# Patient Record
Sex: Male | Born: 1935 | Race: White | Hispanic: No | Marital: Married
Health system: Southern US, Community
[De-identification: ages and names within clinical notes are randomized; demographics above are authoritative.]

---

## 2006-10-20 ENCOUNTER — Ambulatory Visit: Payer: Self-pay | Admitting: Gastroenterology

## 2006-11-10 ENCOUNTER — Other Ambulatory Visit: Payer: Self-pay

## 2006-11-10 ENCOUNTER — Emergency Department: Payer: Self-pay | Admitting: Emergency Medicine

## 2006-11-22 ENCOUNTER — Ambulatory Visit: Payer: Self-pay | Admitting: Internal Medicine

## 2007-02-04 ENCOUNTER — Other Ambulatory Visit: Payer: Self-pay

## 2007-02-04 ENCOUNTER — Emergency Department: Payer: Self-pay | Admitting: Unknown Physician Specialty

## 2007-04-17 ENCOUNTER — Ambulatory Visit: Payer: Self-pay | Admitting: Urology

## 2009-01-02 ENCOUNTER — Ambulatory Visit: Payer: Self-pay | Admitting: Gastroenterology

## 2009-09-25 ENCOUNTER — Emergency Department: Payer: Self-pay | Admitting: Emergency Medicine

## 2009-11-25 ENCOUNTER — Inpatient Hospital Stay: Payer: Self-pay | Admitting: Internal Medicine

## 2010-08-20 ENCOUNTER — Ambulatory Visit: Payer: Self-pay | Admitting: Urology

## 2010-09-01 ENCOUNTER — Ambulatory Visit: Payer: Self-pay | Admitting: Urology

## 2010-09-03 LAB — PATHOLOGY REPORT

## 2010-12-16 ENCOUNTER — Inpatient Hospital Stay: Payer: Self-pay | Admitting: Internal Medicine

## 2012-02-01 ENCOUNTER — Inpatient Hospital Stay: Payer: Self-pay | Admitting: Internal Medicine

## 2012-02-01 LAB — COMPREHENSIVE METABOLIC PANEL
Alkaline Phosphatase: 78 U/L (ref 50–136)
Anion Gap: 12 (ref 7–16)
BUN: 16 mg/dL (ref 7–18)
Bilirubin,Total: 1.4 mg/dL — ABNORMAL HIGH (ref 0.2–1.0)
Chloride: 103 mmol/L (ref 98–107)
Co2: 25 mmol/L (ref 21–32)
Creatinine: 1.13 mg/dL (ref 0.60–1.30)
EGFR (African American): >60
Glucose: 94 mg/dL (ref 65–99)
Osmolality: 280 (ref 275–301)
SGPT (ALT): 14 U/L
Sodium: 140 mmol/L (ref 136–145)
Total Protein: 7.6 g/dL (ref 6.4–8.2)

## 2012-02-01 LAB — CBC
HCT: 48.4 % (ref 40.0–52.0)
HGB: 15.8 g/dL (ref 13.0–18.0)
MCH: 30.4 pg (ref 26.0–34.0)
MCHC: 32.7 g/dL (ref 32.0–36.0)
MCV: 93 fL (ref 80–100)
RBC: 5.19 10*6/uL (ref 4.40–5.90)
RDW: 12.6 % (ref 11.5–14.5)
WBC: 15.3 10*3/uL — ABNORMAL HIGH (ref 3.8–10.6)

## 2012-02-01 LAB — PRO B NATRIURETIC PEPTIDE: B-Type Natriuretic Peptide: 2650 pg/mL — ABNORMAL HIGH (ref 0–450)

## 2012-02-02 LAB — BASIC METABOLIC PANEL
Anion Gap: 14 (ref 7–16)
Co2: 25 mmol/L (ref 21–32)
Creatinine: 1.57 mg/dL — ABNORMAL HIGH (ref 0.60–1.30)
EGFR (African American): 54 — ABNORMAL LOW
EGFR (Non-African Amer.): 45 — ABNORMAL LOW
Glucose: 142 mg/dL — ABNORMAL HIGH (ref 65–99)
Osmolality: 290 (ref 275–301)
Potassium: 3.6 mmol/L (ref 3.5–5.1)
Sodium: 142 mmol/L (ref 136–145)

## 2012-02-02 LAB — CBC WITH DIFFERENTIAL/PLATELET
Basophil #: 0 10*3/uL (ref 0.0–0.1)
Basophil %: 0 %
HGB: 14.9 g/dL (ref 13.0–18.0)
Lymphocyte #: 0.6 10*3/uL — ABNORMAL LOW (ref 1.0–3.6)
Lymphocyte %: 3.2 %
MCH: 31.2 pg (ref 26.0–34.0)
MCHC: 33.2 g/dL (ref 32.0–36.0)
MCV: 94 fL (ref 80–100)
Monocyte #: 1 10*3/uL — ABNORMAL HIGH (ref 0.0–0.7)
Neutrophil #: 18 10*3/uL — ABNORMAL HIGH (ref 1.4–6.5)
Neutrophil %: 91.7 %
Platelet: 188 10*3/uL (ref 150–440)
RBC: 4.79 10*6/uL (ref 4.40–5.90)
RDW: 13.6 % (ref 11.5–14.5)

## 2012-02-02 LAB — MAGNESIUM: Magnesium: 1.4 mg/dL — ABNORMAL LOW

## 2012-02-03 LAB — BASIC METABOLIC PANEL
Anion Gap: 11 (ref 7–16)
BUN: 36 mg/dL — ABNORMAL HIGH (ref 7–18)
Calcium, Total: 9 mg/dL (ref 8.5–10.1)
Co2: 26 mmol/L (ref 21–32)
Creatinine: 1.34 mg/dL — ABNORMAL HIGH (ref 0.60–1.30)
EGFR (African American): >60
EGFR (Non-African Amer.): 54 — ABNORMAL LOW
Osmolality: 292 (ref 275–301)
Potassium: 3.6 mmol/L (ref 3.5–5.1)
Sodium: 142 mmol/L (ref 136–145)

## 2012-02-03 LAB — URINALYSIS, COMPLETE
Bilirubin,UR: NEGATIVE
Hyaline Cast: 25
Nitrite: POSITIVE
Ph: 5 (ref 4.5–8.0)
Protein: NEGATIVE
RBC,UR: 8 /HPF (ref 0–5)
Specific Gravity: 1.018 (ref 1.003–1.030)

## 2012-02-03 LAB — CBC WITH DIFFERENTIAL/PLATELET
Basophil #: 0.1 10*3/uL (ref 0.0–0.1)
Eosinophil %: 0 %
MCH: 31.5 pg (ref 26.0–34.0)
Monocyte #: 0.5 10*3/uL (ref 0.0–0.7)
Monocyte %: 2.7 %
Neutrophil %: 94 %
Platelet: 180 10*3/uL (ref 150–440)
RBC: 4.62 10*6/uL (ref 4.40–5.90)
RDW: 13.7 % (ref 11.5–14.5)
WBC: 20 10*3/uL — ABNORMAL HIGH (ref 3.8–10.6)

## 2012-02-04 LAB — CBC WITH DIFFERENTIAL/PLATELET
Eosinophil #: 0 10*3/uL (ref 0.0–0.7)
Eosinophil %: 0 %
Lymphocyte #: 0.4 10*3/uL — ABNORMAL LOW (ref 1.0–3.6)
MCH: 31.5 pg (ref 26.0–34.0)
MCHC: 33.5 g/dL (ref 32.0–36.0)
MCV: 94 fL (ref 80–100)
Monocyte #: 0.6 10*3/uL (ref 0.0–0.7)
Neutrophil #: 14.7 10*3/uL — ABNORMAL HIGH (ref 1.4–6.5)
Neutrophil %: 93.9 %
Platelet: 195 10*3/uL (ref 150–440)
RBC: 4.47 10*6/uL (ref 4.40–5.90)

## 2012-02-04 LAB — BASIC METABOLIC PANEL
Anion Gap: 14 (ref 7–16)
BUN: 32 mg/dL — ABNORMAL HIGH (ref 7–18)
Chloride: 108 mmol/L — ABNORMAL HIGH (ref 98–107)
Creatinine: 1.13 mg/dL (ref 0.60–1.30)
EGFR (African American): >60
EGFR (Non-African Amer.): >60
Glucose: 118 mg/dL — ABNORMAL HIGH (ref 65–99)

## 2012-02-05 LAB — CBC WITH DIFFERENTIAL/PLATELET
Basophil %: 0 %
Eosinophil #: 0 10*3/uL (ref 0.0–0.7)
Eosinophil %: 0 %
HCT: 39.3 % — ABNORMAL LOW (ref 40.0–52.0)
HGB: 13.1 g/dL (ref 13.0–18.0)
Lymphocyte #: 0.6 10*3/uL — ABNORMAL LOW (ref 1.0–3.6)
Lymphocyte %: 5.3 %
MCH: 31.4 pg (ref 26.0–34.0)
MCV: 95 fL (ref 80–100)
Monocyte #: 0.9 10*3/uL — ABNORMAL HIGH (ref 0.0–0.7)
Monocyte %: 8.5 %
Neutrophil #: 9.2 10*3/uL — ABNORMAL HIGH (ref 1.4–6.5)
Platelet: 180 10*3/uL (ref 150–440)
RBC: 4.16 10*6/uL — ABNORMAL LOW (ref 4.40–5.90)
RDW: 13.6 % (ref 11.5–14.5)
WBC: 10.7 10*3/uL — ABNORMAL HIGH (ref 3.8–10.6)

## 2012-02-05 LAB — BASIC METABOLIC PANEL
Anion Gap: 10 (ref 7–16)
Calcium, Total: 8 mg/dL — ABNORMAL LOW (ref 8.5–10.1)
Chloride: 109 mmol/L — ABNORMAL HIGH (ref 98–107)
Co2: 25 mmol/L (ref 21–32)
Creatinine: 1.06 mg/dL (ref 0.60–1.30)
Sodium: 144 mmol/L (ref 136–145)

## 2012-02-06 LAB — CULTURE, BLOOD (SINGLE)

## 2012-02-07 LAB — BASIC METABOLIC PANEL
Anion Gap: 11 (ref 7–16)
BUN: 20 mg/dL — ABNORMAL HIGH (ref 7–18)
Calcium, Total: 8.4 mg/dL — ABNORMAL LOW (ref 8.5–10.1)
Co2: 28 mmol/L (ref 21–32)
Creatinine: 1.03 mg/dL (ref 0.60–1.30)
EGFR (African American): >60
Glucose: 101 mg/dL — ABNORMAL HIGH (ref 65–99)

## 2012-02-14 ENCOUNTER — Ambulatory Visit: Payer: Self-pay

## 2012-03-30 ENCOUNTER — Emergency Department: Payer: Self-pay | Admitting: Emergency Medicine

## 2012-05-13 DEATH — deceased

## 2013-06-08 IMAGING — CR DG CHEST 2V
1 series · 4 of 4 positions shown · non-contrast
Comparison: none

REASON FOR EXAM: cough
COMMENTS:

[Series 1: ap · 0.17mm/px · 4 of 4 slices shown]
[im 1/4]
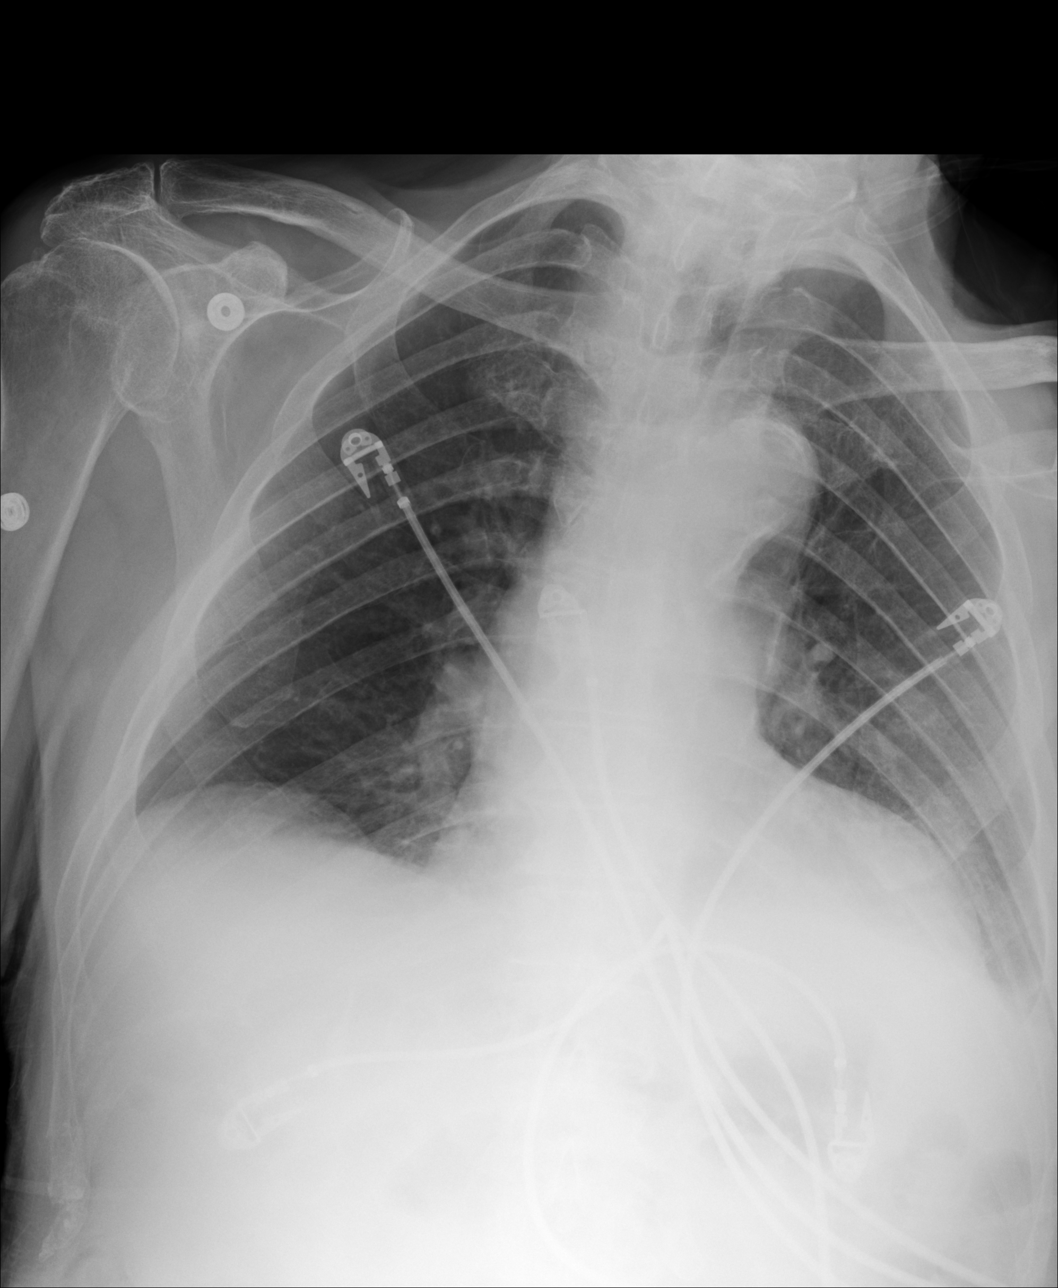
[im 2/4]
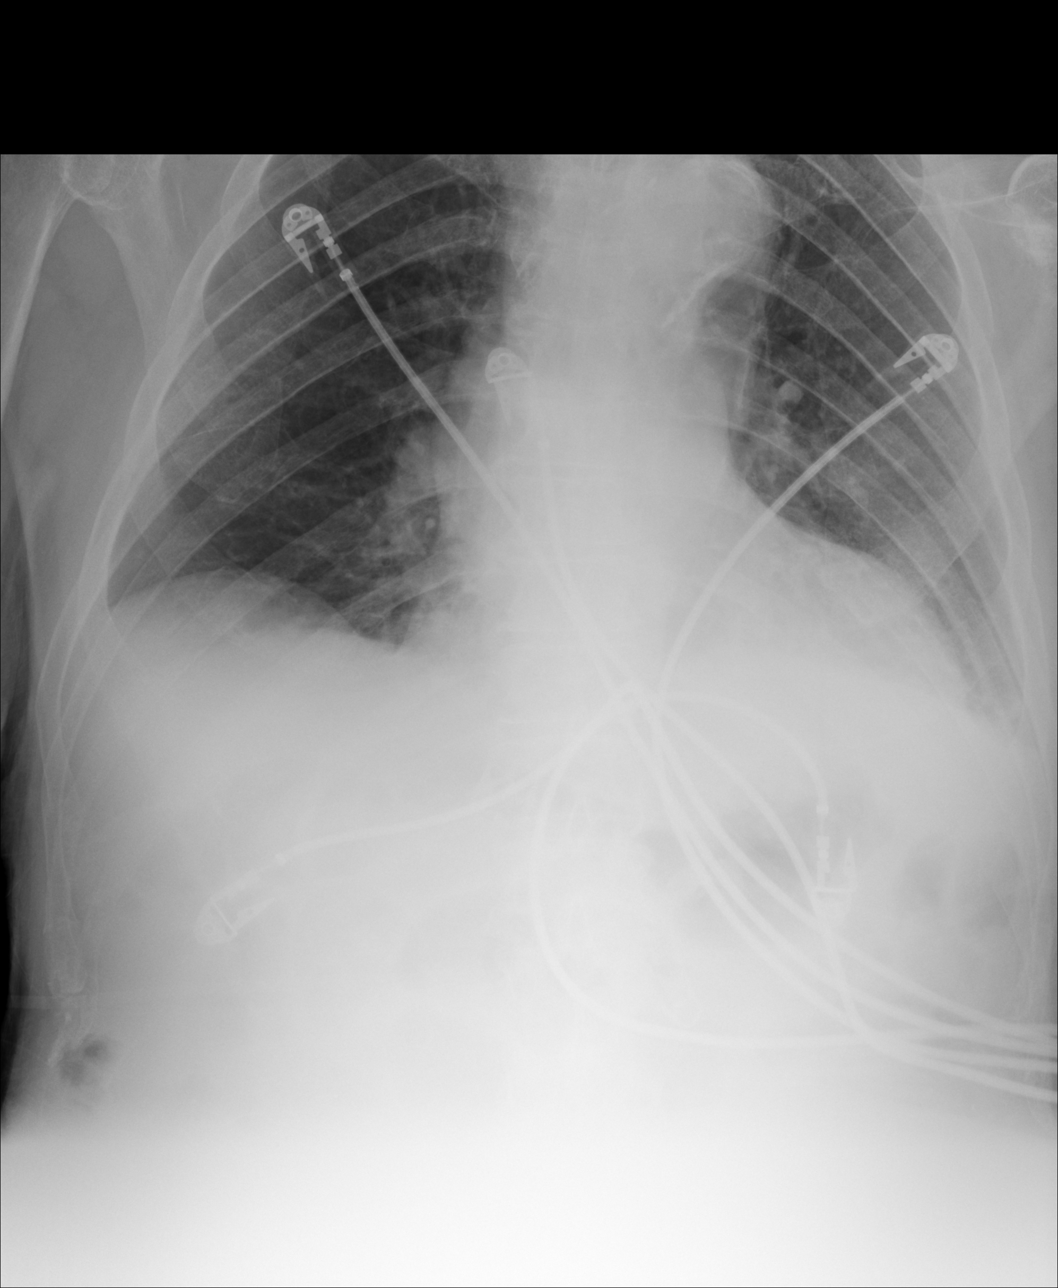
[im 3/4]
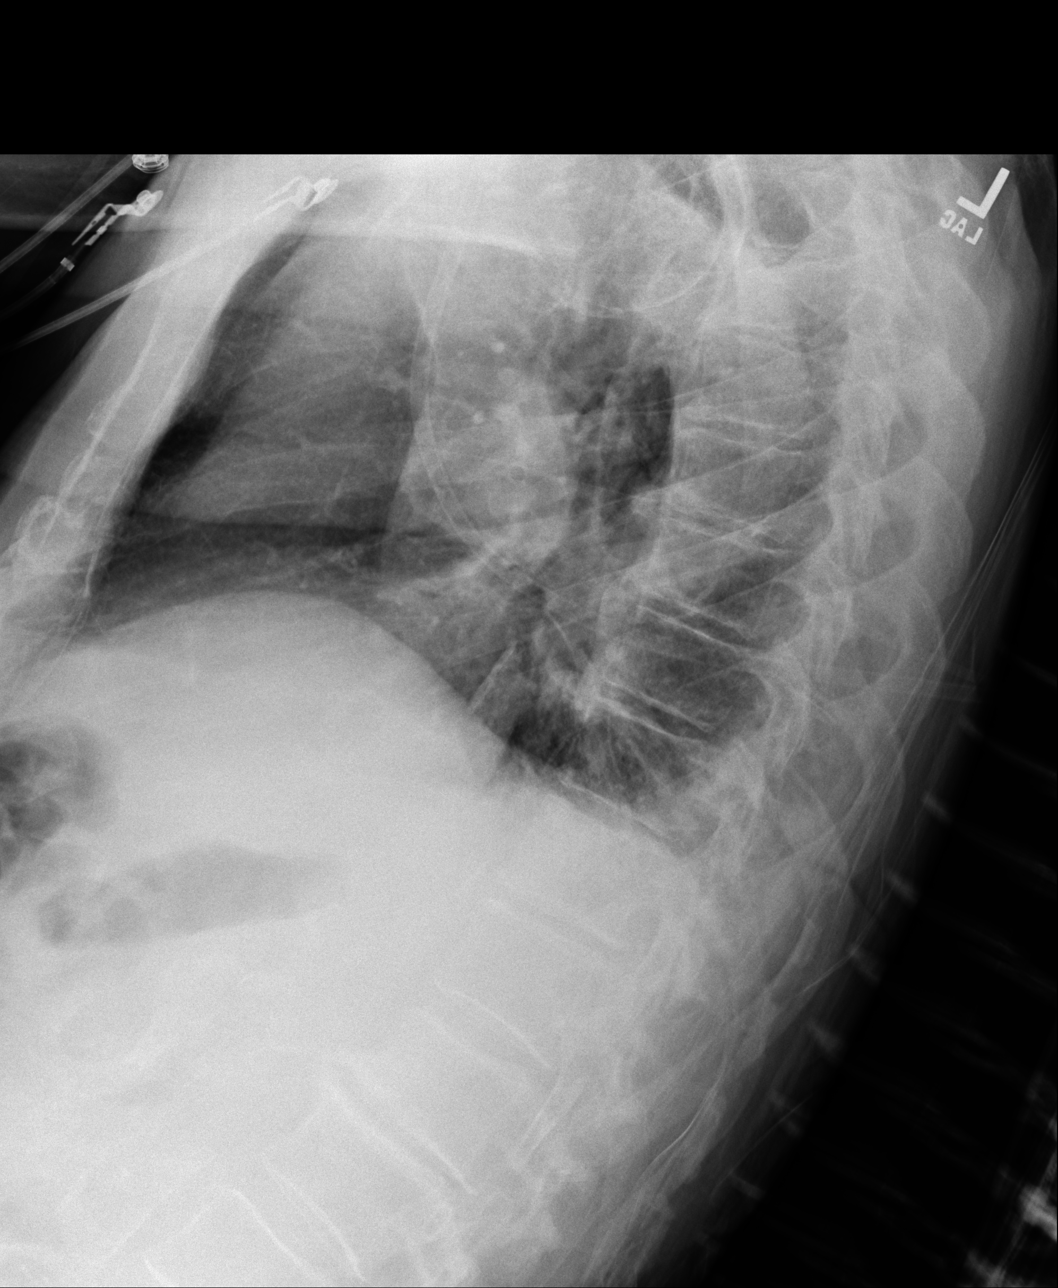
[im 4/4]
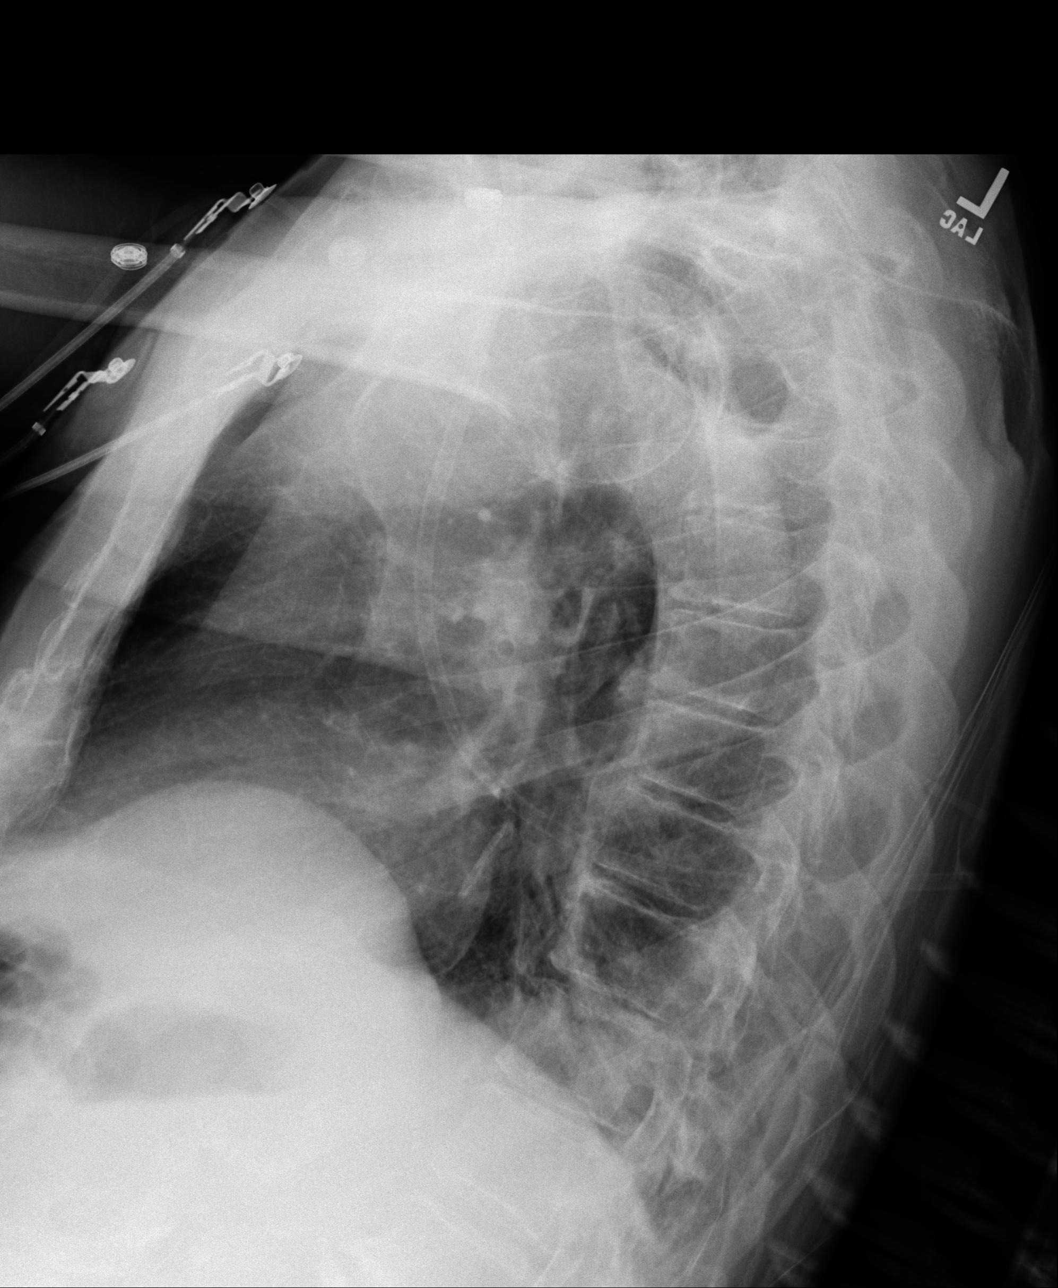

[4 of 4 positions shown; findings below may reference images not displayed]

PROCEDURE:     DXR - DXR CHEST PA (OR AP) AND LATERAL  - February 02, 2012 [DATE]

RESULT:     Comparison is made to the prior exam of 01/31/2002. The lung
fields are clear. No pneumonia, pneumothorax or pleural effusion is seen. No
acute changes of the heart or pulmonary vasculature are identified. The
heart is upper limits for normal in size or mildly enlarged. The lungs
appear bilaterally hyperinflated.
IMPRESSION: 1. The lung fields are clear.
2. No pulmonary edema is seen.
3. The heart is upper limits for normal in size.
4. The chest appears mildly hyperinflated bilaterally.

## 2013-08-04 IMAGING — CT CT HEAD WITHOUT CONTRAST
2 series · 16 of 30 positions shown, 20 images · non-contrast
Comparison: none

REASON FOR EXAM: trauma, left supraorbital
COMMENTS:

PROCEDURE:     CT  - CT HEAD WITHOUT CONTRAST  - March 30, 2012  [DATE]
RESULT:     Entry: Trauma.
Comparison Study: Prior CT of 02/01/2012.

[Series 2: without · axial · non-contrast · 0.42mm/px · z∈[+378,+508]mm · 13 of 32 slices shown, 17 images]
[im 3/32  brain]
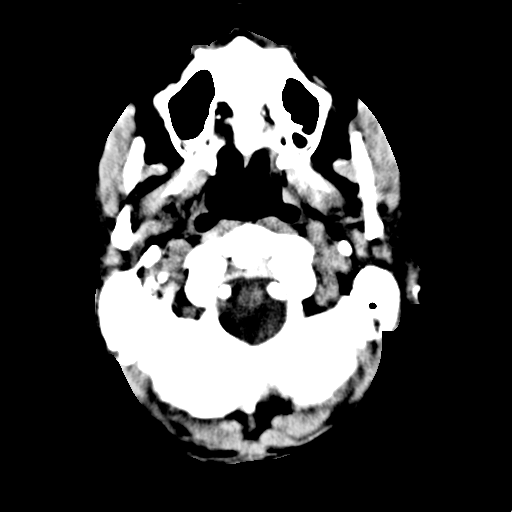
[im 3/32  bone]
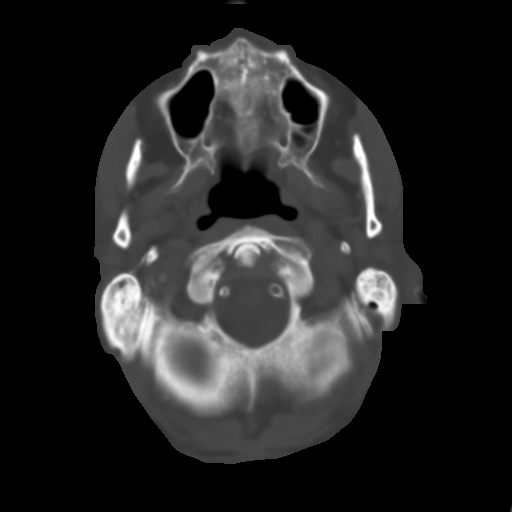
[im 5/32  brain]
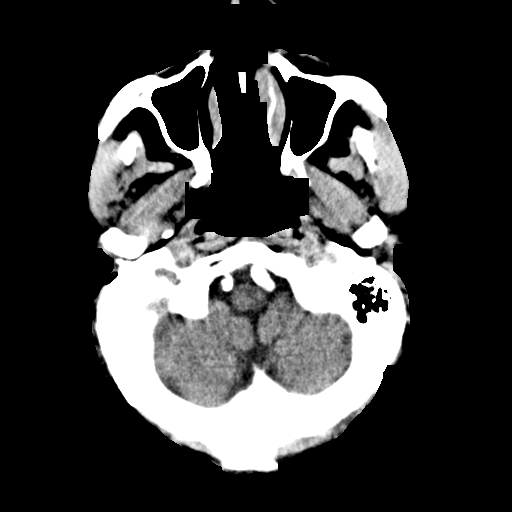
[im 7/32  brain]
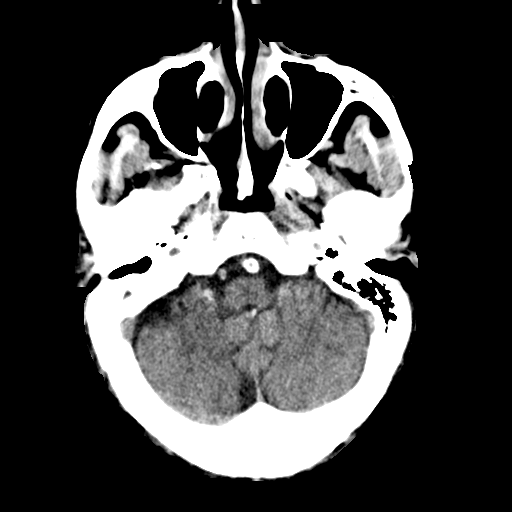
[im 9/32  brain]
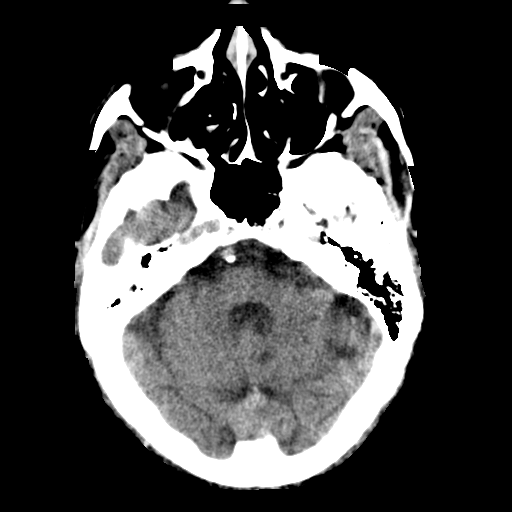
[im 12/32  brain]
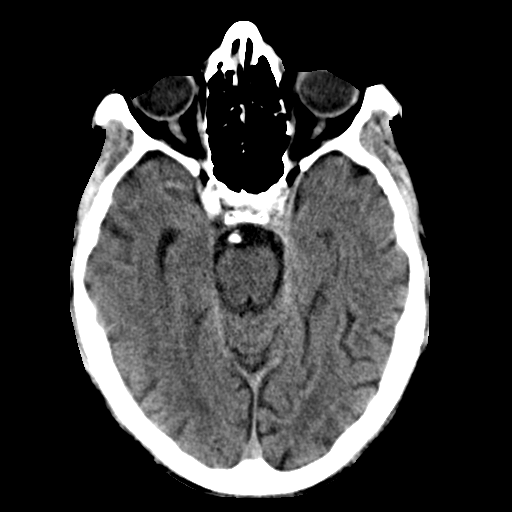
[im 12/32  bone]
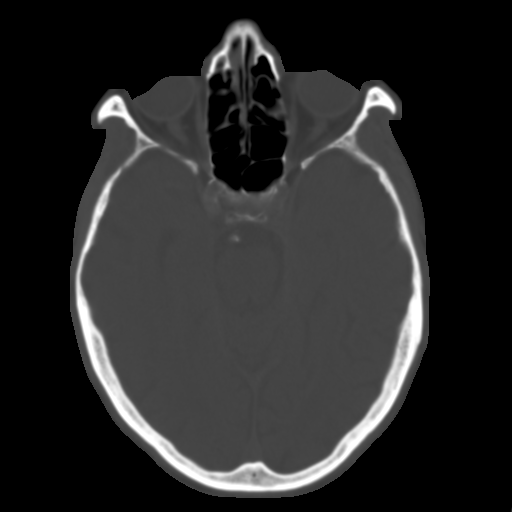
[im 14/32  brain]
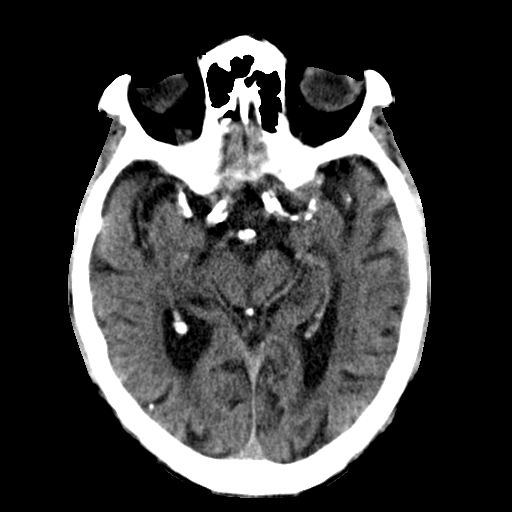
[im 16/32  brain]
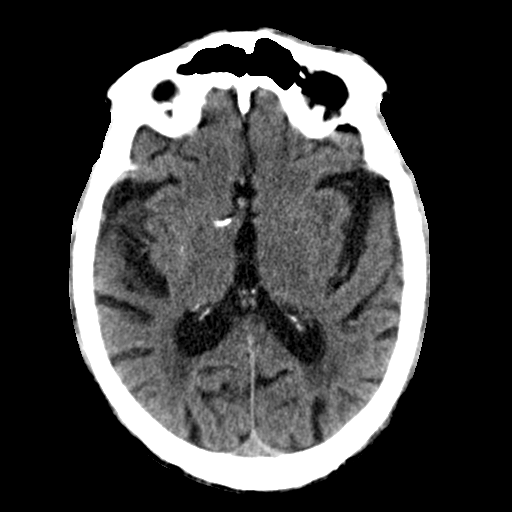
[im 18/32  brain]
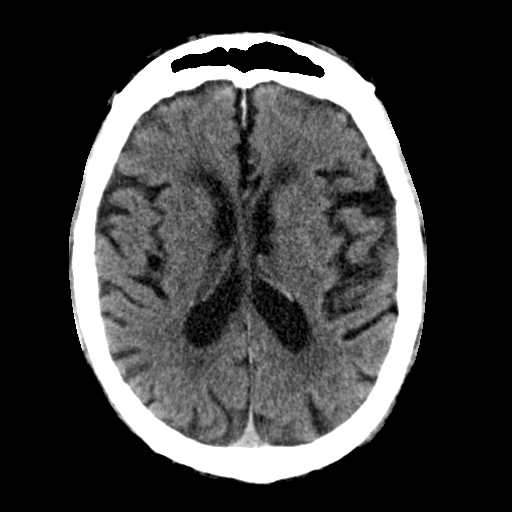
[im 20/32  brain]
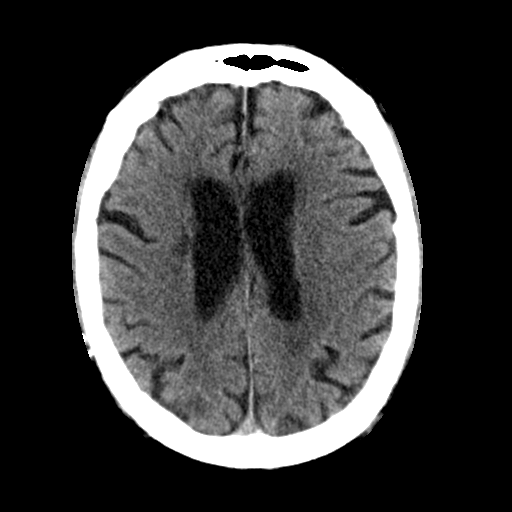
[im 20/32  bone]
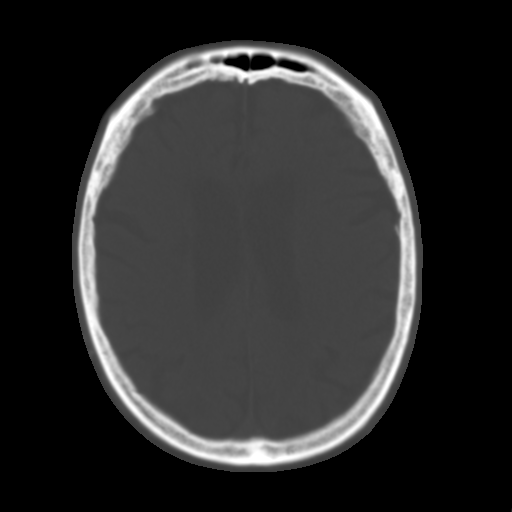
[im 23/32  brain]
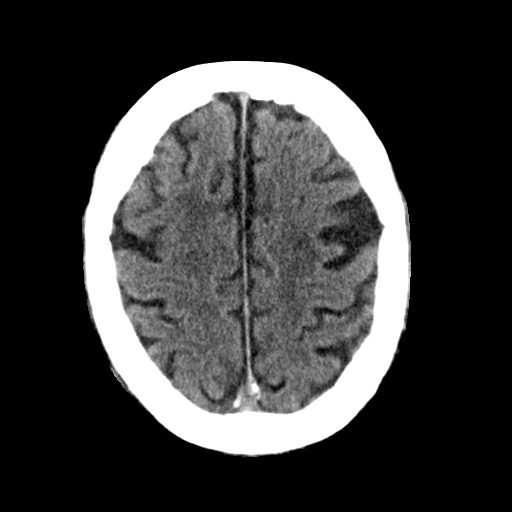
[im 25/32  brain]
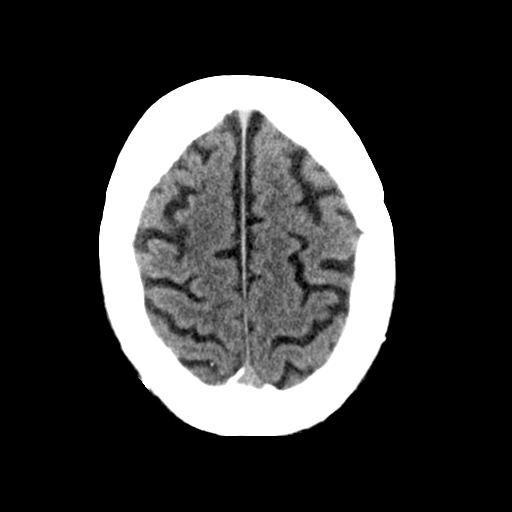
[im 27/32  brain]
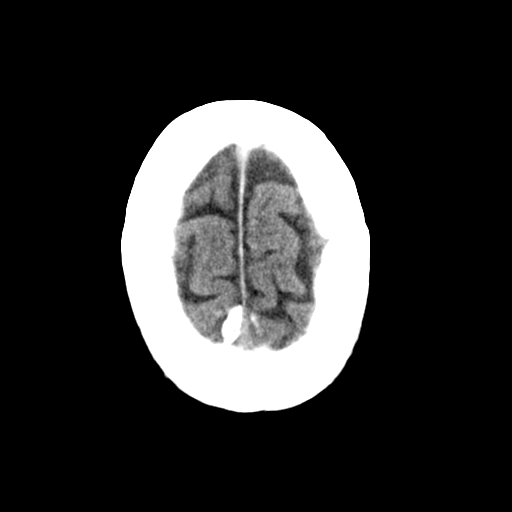
[im 29/32  brain]
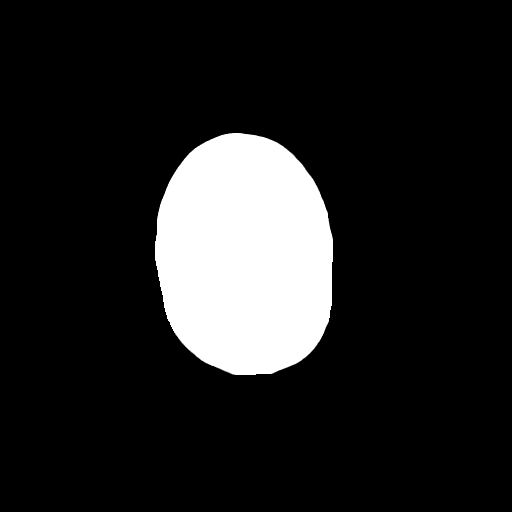
[im 29/32  bone]
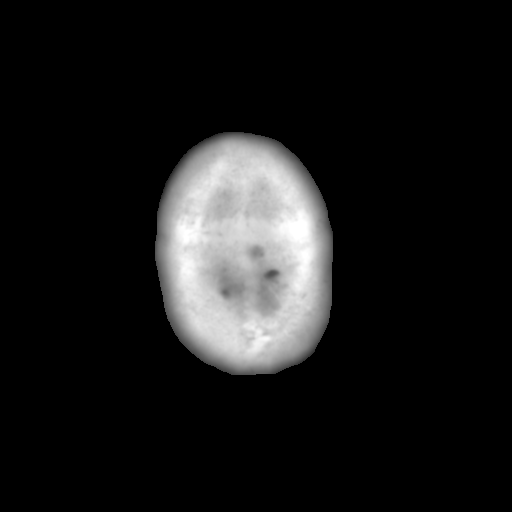

[Series 3: bone · axial · 0.42mm/px · z∈[+378,+423]mm · 3 of 32 slices shown]
[im 3/32  bone]
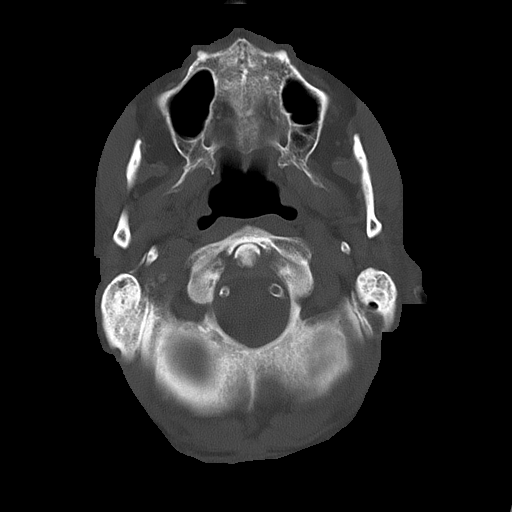
[im 7/32  bone]
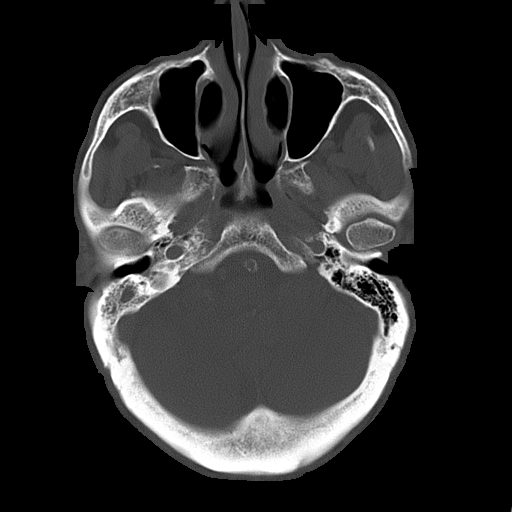
[im 12/32  bone]
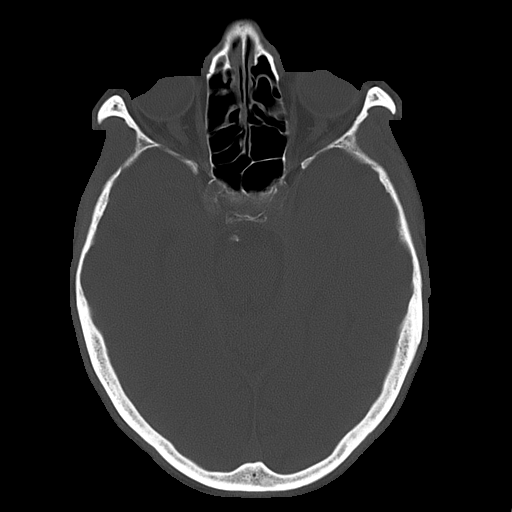

[16 of 30 positions shown; findings below may reference images not displayed]

FINDINGS: Diffuse atrophy present. White matter changes noted consistent
with chronic ischemia. No mass lesion. No hydrocephalus. No hemorrhage. Soft
tissue swelling of the left frontal region. Laceration may be present.
IMPRESSION: No acute abnormality.

## 2015-04-06 NOTE — Discharge Summary (Signed)
PATIENT NAME:  Jeremy Wilson, Jeremy Wilson MR#:  [id number] DATE OF BIRTH:  09-02-1925  DATE OF ADMISSION:  02/01/2012 DATE OF DISCHARGE:  02/07/2012  DISCHARGE DIAGNOSES:  1. Acute respiratory failure from chronic obstructive pulmonary disease exacerbation.  2. Bronchitis with wheezing.  3. Dementia.  4. Benign prostatic hypertrophy.   DISCHARGE MEDICATIONS:  1. Prednisone 40 mg daily decreasing by 5 mg every other day until off.  2. Levaquin 500 mg daily for three days.  3. Nystatin powder three times daily to affected area until clear.  4. Flomax 0.4 mg daily.  5. Macrodantin 50 mg daily at bedtime, after Levaquin is finished, per urology recommendations in the past.  6. Metoprolol ER 25 mg daily.  7. Celexa 10 mg daily.  8. Aspirin 325 mg daily.  9. Mucinex 500 mg twice a day. 10. Lovenox 40 mg daily for two weeks.  11. Tylenol p.r.n.   HISTORY AND PHYSICAL: Please see the detailed history and physical done on admission.   HOSPITAL COURSE: The patient was admitted coughing and congested, on oxygen, which he previously had not been, and more confused than baseline. He was treated with steroids, antibiotics, and bronchodilators. He did slowly improve. He still had some cough and congestion, but much improved, just rhonchorous now without any marked wheezing. Cardiovascularly he has been stable, his vital signs, etc. We will slowly taper his prednisone. He will need further PT and OT as he is more confused, not nearly as ambulatory as he had been. He just has his wife to care for him at home. He had no significant issues with his labs, other than a mild leukocytosis secondary to his illness, steroids, etc. This was around 15,000 on admission    and decreased to 10,700 two days ago, which is the last one drawn. His saturations are in the mid 90s on 2 liters nasal cannula. That can be weaned as feasible.   TIME SPENT: It took approximately 35 minutes to do discharge tasks today.   ____________________________ Marya AmslerMarshall W. Dareen PianoAnderson, MD mwa:slb Wilson: 02/07/2012 08:06:27 ET T: 02/07/2012 08:19:55 ET JOB#: [id number]  cc: Marya AmslerMarshall W. Dareen PianoAnderson, MD, <Dictator>  Lauro RegulusMARSHALL W Karlene Southard MD ELECTRONICALLY SIGNED 02/07/2012 10:24

## 2015-04-06 NOTE — H&P (Signed)
PATIENT NAME:  Jeremy Wilson, [name] MR#:  [id number] DATE OF BIRTH:  03-06-1925  DATE OF ADMISSION:  02/01/2012  PRIMARY MD: Einar CrowMarshall Anderson, MD   CHIEF COMPLAINT: Shortness of breath, cough, upper respiratory congestion.   HISTORY OF PRESENT ILLNESS: The patient is an 81 year old white male with history of Parkinson's disease and dementia who has not been feeling well for the past one week. According to his wife, he has been having a significant amount of nonproductive cough and congestion. She tried to treat his symptoms with over-the-counter medications. However, he continued to have progressive symptoms. This morning he started becoming short of breath. He does also have a history of a CVA and has chronic right-sided weakness and he started complaining of worsening weakness. The patient in the ED was noted to have leukocytosis. His chest x-ray was negative, however, he has a significant amount of rhonchi on his breath sounds. He was noted to be hypoxic on the ABG. Otherwise, he has had low-grade temps but no chills. He has not had any myalgias. No headaches. No recent sick contacts. He denies any chest pain or shortness of breath. He also has had decrease in p.o. intake because of the illness. He denies any abdominal pain, nausea, vomiting, or diarrhea. No urinary frequency, urgency, or hesitancy.   PAST MEDICAL HISTORY:  1. Hypertension.  2. Nephrolithiasis.  3. Dyslipidemia.  4. History of sleep apnea, status post surgery for that.  5. History of cerebrovascular accident, according to his wife multiple, last one in 2007 left him with right eye visual difficulties and right-sided weakness.  6. History of erectile dysfunction.  7. History of bladder stone.  8. Benign prostatic hypertrophy.  9. Parkinson's disease with dementia.  10. Hard of hearing.   ALLERGIES: None.    PAST SURGICAL HISTORY:  1. Status post bilateral hip replacement.  2. Status post rhinoplasty.  3. History of prostate  operation with laser in 1997.   CURRENT MEDICATIONS:  1. Aspirin 325 daily.  2. Celexa 10 daily.  3. Metoprolol succinate 25 daily.  4. Nitrofurantoin 50 one tab p.o. daily.  5. Flomax 0.4 daily.  6. Vitamin B12 1000 mcg once a month.  7. Zofran 4 mg 1 tab p.o. t.i.d. for nausea.   SOCIAL HISTORY: No smoking, no alcohol, no drug use.   FAMILY HISTORY: Positive for father had heart disease and congestive heart failure. No family history of diabetes or hypertension.   REVIEW OF SYSTEMS: CONSTITUTIONAL: Complains of subjective fever, fatigue, weakness. No pain. No weight loss. No weight gain. EYES: No blurred or double vision. No pain or redness. No inflammation. No glaucoma. No cataracts. ENT: No tinnitus. No ear pain. No hearing loss. No allergies, seasonal or year round. No epistaxis. No nasal discharge. No difficulty swallowing. RESPIRATORY: Complains of nonproductive cough. No wheezing. No hemoptysis. Complains of dyspnea. No asthma. No painful respirations. No COPD. No TB. CARDIOVASCULAR: No chest pain. No orthopnea. Does have trace edema. No arrhythmia. No palpitations. No syncope. GI: No nausea, vomiting, diarrhea. No abdominal pain. No hematemesis. No melena. No changes in bowel habits. No hemorrhoids. GU: Denies any dysuria, hematuria, renal calculus, or frequency. ENDOCRINE: Denies any polyuria, nocturia, or thyroid problems. No increase in sweating, heat or cold intolerance. HEME/LYMPH: Denies any anemia, easy bruisability, bleeding, or swollen glands. SKIN: No acne, no rash, no lesions. No change in mole, hair or skin. MUSCULOSKELETAL: No pain in the neck, back, or shoulder. NEUROLOGIC: No numbness, no weakness, no dysarthria, no CVA,  no TIA. No seizures. PSYCHIATRIC: No anxiety, no insomnia, no ADD, no OCD.   PHYSICAL EXAMINATION:   VITAL SIGNS: Temperature 97.5, pulse 70, respirations 22, blood pressure 144/77, oxygen saturation 94% on 2 liters.   GENERAL: The patient is a  debilitated 81 year old white male in mild respiratory distress.   HEENT: Head atraumatic, normocephalic. Pupils equal, round, reactive to light and accommodation. Conjunctivae no pallor. Sclerae anicteric. Oropharynx no exudates. Ear exam shows no erythema or swelling.   NECK: No thyromegaly. No carotid bruits.  CARDIOVASCULAR: Regular rate and rhythm. No murmurs, rubs, clicks, or gallops. PMI is not displaced.   LUNGS: Bilateral rhonchi throughout the lungs especially in the upper lobes bilaterally. There is no active wheezing. There are no crackles.   ABDOMEN: Soft, nontender, nondistended. Positive bowel sounds x4.   EXTREMITIES: Trace edema.   SKIN: There is no rash.   LYMPHATICS: No lymph nodes palpable.   MUSCULOSKELETAL: There is no erythema or swelling.   NEUROLOGIC: There is some right-sided weakness 4 out of 5; left side is 5 out of 5 in upper and lower extremity.   VASCULAR: Good DP, PT pulses.   PSYCHIATRIC: Not anxious or depressed.   LABORATORY, DIAGNOSTIC, AND RADIOLOGICAL DATA: In the ED PA and lateral chest x-ray shows no acute abnormality. ABG pH 7.49, pCO2 29, pO2 of 60. BNP was 2650. CK-MB 2.0. CPK 478. Glucose 94, BUN 16, creatinine 1.13, sodium 140, potassium 3.2, chloride 103, CO2 25. LFTs were normal except for bilirubin total of 1.4. WBC 15.3, hemoglobin 15.8, platelet count 188. Troponin less than 0.02. EKG showed sinus rhythm with first degree AV block, incomplete right bundle branch block.   ASSESSMENT AND PLAN: The patient is an 81 year old white male with history of Parkinson's dementia who presents with upper respiratory symptoms with cough, congestion, hypoxia.  1. Acute respiratory failure likely bronchitis with bronchospasms. At this time will treat patient with IV Levaquin, nebulizers, and supportive oxygen care. I will also get a swallow evaluation to make sure he is not having issues with aspiration. Try to obtain sputum cultures if possible. Place  him on Mucinex.  2. Right-sided weakness which is chronic with some worsening. Will check a CT scan of the head and make sure there is no CVA.  3. Parkinson dementia. Is not on current medications. Continue to monitor.  4. Elevated BNP, some trace lower extremity edema. No overt congestive heart failure. Will get echocardiogram.  5. Hypertension. Continue metoprolol as taking at home.  6. Benign prostatic hypertrophy. Continue Flomax as taking at home.  7. Miscellaneous. Will place him on Lovenox for DVT prophylaxis.   time spent 35 min ____________________________ Lacie Scotts. Allena Katz, MD shp:drc D: 02/01/2012 13:33:41 ET T: 02/01/2012 14:11:56 ET JOB#: [id number]  cc: Nykia Turko H. Allena Katz, MD, <Dictator> Marya Amsler. Dareen Piano, MD Charise Carwin MD ELECTRONICALLY SIGNED 02/18/2012 7:51
